# Patient Record
Sex: Female | Born: 1970 | Race: White | Hispanic: No | Marital: Married | Smoking: Never smoker
Health system: Southern US, Community
[De-identification: ages and names within clinical notes are randomized; demographics above are authoritative.]

## PROBLEM LIST (undated history)

## (undated) NOTE — Progress Notes (Signed)
 Formatting of this note is different from the original. Hannah Rice is a 33 yo WF presenting today for concerns about lesions on her vulva. 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 She first noted these areas a few months ago. She had her pap smear with her PCP 08/2021.  Past Medical History:  Diagnosis Date  ? Anxiety   ? Herpes    Past Surgical History:  Procedure Laterality Date  ? SIMPLE VULVECTOMY     OB History    Gravida  0   Para  0   Term  0   Preterm  0   AB  0   Living  0    SAB  0   IAB  0   Ectopic  0   Molar  0   Multiple  0   Live Births  0      Current Outpatient Medications on File Prior to Visit  Medication Sig Dispense Refill  ? montelukast (SINGULAIR) 10 mg tablet Take 10 mg by mouth nightly.    ? sertraline  (ZOLOFT ) 100 MG tablet sertraline  100 mg tablet    ? valACYclovir (VALTREX) 500 MG tablet AS NEEDED TAKE 1 TABLET BY MOUTH EVERY 12 HOURS X 3 DAYS     No current facility-administered medications on file prior to visit.   Allergies  Allergen Reactions  ? Sulfa (Sulfonamide Antibiotics) Hives   Vitals:   05/09/22 0916  BP: 114/76  Pulse: 73  Weight: 155 lb (70.3 kg)  Height: 5' 4 (1.626 m)   Gen; WF in NAD  Pelvic: Vulva with multiple pigmented lesions, 3 raised lesions on left side of vulva near perineum  Area is prepped with betadine, injected with lidocaine , 3.5mm punch biopsy collected, hemostasis achieved with silver nitrate  Vulvar dysplasia  1. Vulvar biopsy collected. Will notify her of results from pathology. 2. Discussed Gardasil. Will do first injection today. 3. She will follow up in August for her AE.  Delon B. Christena, MD, 05/09/2022, 09:49   Electronically signed by Delon FURY Christena, MD at 05/09/2022  9:50 AM EDT

## (undated) NOTE — Addendum Note (Signed)
 Addended by: GIBBS, SHANEQUA LASHAUN on: 05/09/2022 10:07 AM   Modules accepted: Orders   Electronically signed by Kristine Marijane Griffiths, CMA at 05/09/2022 10:07 AM EDT

---

## 2016-03-08 ENCOUNTER — Emergency Department: Payer: BC Managed Care – PPO

## 2016-03-08 ENCOUNTER — Inpatient Hospital Stay
Admission: EM | Admit: 2016-03-08 | Discharge: 2016-03-10 | DRG: 494 | Disposition: A | Discharge status: Home / Self Care | Payer: BC Managed Care – PPO | Attending: Specialist | Admitting: Specialist

## 2016-03-08 ENCOUNTER — Encounter: Payer: Self-pay | Admitting: Emergency Medicine

## 2016-03-08 DIAGNOSIS — S82851A Displaced trimalleolar fracture of right lower leg, initial encounter for closed fracture: Principal | ICD-10-CM | POA: Diagnosis present

## 2016-03-08 DIAGNOSIS — Z882 Allergy status to sulfonamides status: Secondary | ICD-10-CM

## 2016-03-08 DIAGNOSIS — Y929 Unspecified place or not applicable: Secondary | ICD-10-CM

## 2016-03-08 DIAGNOSIS — R262 Difficulty in walking, not elsewhere classified: Secondary | ICD-10-CM

## 2016-03-08 DIAGNOSIS — Y9329 Activity, other involving ice and snow: Secondary | ICD-10-CM

## 2016-03-08 DIAGNOSIS — M25571 Pain in right ankle and joints of right foot: Secondary | ICD-10-CM

## 2016-03-08 DIAGNOSIS — W000XXA Fall on same level due to ice and snow, initial encounter: Secondary | ICD-10-CM | POA: Diagnosis present

## 2016-03-08 DIAGNOSIS — S82891A Other fracture of right lower leg, initial encounter for closed fracture: Secondary | ICD-10-CM | POA: Diagnosis present

## 2016-03-08 DIAGNOSIS — F10129 Alcohol abuse with intoxication, unspecified: Secondary | ICD-10-CM | POA: Diagnosis present

## 2016-03-08 DIAGNOSIS — Z79899 Other long term (current) drug therapy: Secondary | ICD-10-CM

## 2016-03-08 LAB — BASIC METABOLIC PANEL
Anion gap: 9 (ref 5–15)
BUN: 6 mg/dL (ref 6–20)
CHLORIDE: 105 mmol/L (ref 101–111)
CO2: 28 mmol/L (ref 22–32)
CREATININE: 0.64 mg/dL (ref 0.44–1.00)
Calcium: 9.5 mg/dL (ref 8.9–10.3)
GFR calc non Af Amer: >60 mL/min (ref >60)
Glucose, Bld: 131 mg/dL — ABNORMAL HIGH (ref 65–99)
Potassium: 3.6 mmol/L (ref 3.5–5.1)
Sodium: 142 mmol/L (ref 135–145)

## 2016-03-08 LAB — CBC
HEMATOCRIT: 40.9 % (ref 35.0–47.0)
HEMOGLOBIN: 14.1 g/dL (ref 12.0–16.0)
MCH: 33.2 pg (ref 26.0–34.0)
MCHC: 34.4 g/dL (ref 32.0–36.0)
MCV: 96.5 fL (ref 80.0–100.0)
Platelets: 243 10*3/uL (ref 150–440)
RBC: 4.24 MIL/uL (ref 3.80–5.20)
RDW: 13.3 % (ref 11.5–14.5)
WBC: 16.5 10*3/uL — ABNORMAL HIGH (ref 3.6–11.0)

## 2016-03-08 LAB — PROTIME-INR
INR: 1
PROTHROMBIN TIME: 13.2 s (ref 11.4–15.2)

## 2016-03-08 LAB — TYPE AND SCREEN
ABO/RH(D): O POS
ANTIBODY SCREEN: NEGATIVE

## 2016-03-08 LAB — APTT: aPTT: 27 seconds (ref 24–36)

## 2016-03-08 MED ORDER — SODIUM CHLORIDE 0.45 % IV SOLN
INTRAVENOUS | Status: DC
  Administered 2016-03-08: 20:00:00 via INTRAVENOUS

## 2016-03-08 MED ORDER — FENTANYL CITRATE (PF) 100 MCG/2ML IJ SOLN
50.0000 ug | Freq: Once | INTRAMUSCULAR | Status: AC
  Administered 2016-03-08: 50 ug via INTRAVENOUS
  Filled 2016-03-08: qty 2

## 2016-03-08 MED ORDER — FENTANYL CITRATE (PF) 100 MCG/2ML IJ SOLN
INTRAMUSCULAR | Status: AC
  Administered 2016-03-08: 50 ug via INTRAVENOUS
  Filled 2016-03-08: qty 2

## 2016-03-08 MED ORDER — FENTANYL CITRATE (PF) 100 MCG/2ML IJ SOLN: 50.0000 ug | Freq: Once | INTRAMUSCULAR | Status: DC

## 2016-03-08 MED ORDER — MORPHINE SULFATE (PF) 2 MG/ML IV SOLN
INTRAVENOUS | Status: AC
  Filled 2016-03-08: qty 1

## 2016-03-08 MED ORDER — FENTANYL CITRATE (PF) 100 MCG/2ML IJ SOLN
50.0000 ug | Freq: Once | INTRAMUSCULAR | Status: AC
Start: 2016-03-08 — End: 2016-03-08
  Administered 2016-03-08: 50 ug via INTRAVENOUS

## 2016-03-08 MED ORDER — MORPHINE SULFATE (PF) 2 MG/ML IV SOLN
2.0000 mg | Freq: Once | INTRAVENOUS | Status: AC
  Administered 2016-03-08: 2 mg via INTRAVENOUS

## 2016-03-08 NOTE — ED Notes (Signed)
Assisted pt to bedside commode pt tolerated movement well.

## 2016-03-08 NOTE — ED Notes (Signed)
Pt. Resting comfortably in bed. States pain is not excruciating, but uncomfortable. Pending orders from Dr. Hyacinth MeekerMiller.

## 2016-03-08 NOTE — ED Notes (Signed)
Pt. Provided water and ice at this time. Also using phone to call mother. NAD noted, will continue to monitor.

## 2016-03-08 NOTE — H&P (Signed)
PREOPERATIVE H&P  Chief Complaint: Pain right ankle   HPI: Hannah Rice is a 46 y.o. female who fell in the snow today playing with her dogs.  She injured her right ankle and was brought to the emergency room.  Exam and x-rays revealed a trimalleolar fracture dislocation of the ankle.  Closed reduction and splinting were carried out by the emergency room physician.  While I was in the operating room.  I discussed the problem with the patient fully.  Explained her that operative fixation.  Generally pertinent for produces much better results.  Then attempts to treat this as a unstable fracture in a cast.  Some benefits of surgery including infection, anesthesia complications, blood clot and nonunion were discussed.  She has elected for surgical management.  We will plan to do this early tomorrow morning.  Her health is good.  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  History reviewed. No pertinent past medical history. History reviewed. No pertinent surgical history. Social History   Social History  . Marital status: Married    Spouse name: N/A  . Number of children: N/A  . Years of education: N/A   Social History Main Topics  . Smoking status: Never Smoker  . Smokeless tobacco: Never Used  . Alcohol use Yes  . Drug use: Unknown  . Sexual activity: Not Asked   Other Topics Concern  . None   Social History Narrative  . None   No family history on file. Allergies  Allergen Reactions  . Sulfa Antibiotics Hives   Prior to Admission medications   Medication Sig Start Date End Date Taking? Authorizing Provider  clonazePAM (KLONOPIN) 0.5 MG tablet Take 0.5 mg by mouth at bedtime as needed for anxiety.   Yes Historical Provider, MD  fluticasone (FLONASE) 50 MCG/ACT nasal spray Place 2 sprays into both nostrils daily as needed for allergies or rhinitis.   Yes Historical Provider, MD  sertraline (ZOLOFT) 100 MG tablet Take 100 mg by mouth daily.   Yes Historical Provider, MD      Positive ROS: All other systems have been reviewed and were otherwise negative with the exception of those mentioned in the HPI and as above.  Physical Exam: General: Alert, no acute distress Cardiovascular: No pedal edema. Heart is regular and without murmur.  Respiratory: No cyanosis, no use of accessory musculature. Lungs are clear. GI: No organomegaly, abdomen is soft and non-tender Skin: No lesions in the area of chief complaint Neurologic: Sensation intact distally Psychiatric: Patient is competent for consent with normal mood and affect Lymphatic: No axillary or cervical lymphadenopathy  MUSCULOSKELETAL: The right ankle is splinted.  Her vascular status is good.  No other injuries are noted.  Hip and knee motion is good.  Dressings are clean and dry.  Emergency room physician.  Relates that the skin was intact.  Assessment: Trimalleolar fracture dislocation, right ankle  Plan: Open reduction, internal fixation right ankle in a.m.  The risks benefits and alternatives were discussed with the patient including but not limited to the risks of nonoperative treatment, versus surgical intervention including infection, bleeding, nerve injury,  blood clots, cardiopulmonary complications, morbidity, mortality, among others, and they were willing to proceed.   Valinda HoarMILLER,Remell Giaimo E, MD [phone]   03/08/2016 10:26 PM

## 2016-03-08 NOTE — ED Provider Notes (Addendum)
[facility] Emergency Department Provider Note  ____________________________________________  Time seen: Approximately 4:48 PM  I have reviewed the triage vital signs and the nursing notes.   HISTORY  Chief Complaint Ankle Injury    HPI Hannah Rice is a 46 y.o. female , otherwise healthy nonpregnant, presenting with right ankle pain and deformity after mechanical fall. The patient states that she tripped, did not hit her head or lose consciousness. She attempted to ambulate but was unable to get back up. She denies any numbness or tingling, or skin break. No neck or back pain.+ sign ETOH today.   History reviewed. No pertinent past medical history.  There are no active problems to display for this patient.   History reviewed. No pertinent surgical history.    Allergies Sulfa antibiotics  No family history on file.  Social History Social History  Substance Use Topics  . Smoking status: Never Smoker  . Smokeless tobacco: Never Used  . Alcohol use Yes    Review of Systems Constitutional: No fever/chills.Positive mechanical fall. No loss of consciousness. No lightheadedness or syncope. Eyes: No visual changes. ENT: No sore throat. No congestion or rhinorrhea. Cardiovascular: Denies chest pain. Denies palpitations. Respiratory: Denies shortness of breath.  No cough. Gastrointestinal: No abdominal pain.  No nausea, no vomiting.  No diarrhea.  No constipation. Genitourinary: Negative for dysuria. Musculoskeletal: Negative for back pain.No neck pain. Positive right ankle pain. Skin: Negative for rash. Neurological: Negative for headaches. No focal numbness, tingling or weakness.   10-point ROS otherwise negative.  ____________________________________________   PHYSICAL EXAM:  VITAL SIGNS: ED Triage Vitals [03/08/16 1641]  Enc Vitals Group     BP 122/77     Pulse Rate 100     Resp 20     Temp 97.6 F (36.4 C)     Temp Source Oral   SpO2 96 %     Weight 138 lb (62.6 kg)     Height 5\' 4"  (1.626 m)     Head Circumference      Peak Flow      Pain Score 6     Pain Loc      Pain Edu?      Excl. in GC?     Constitutional: Alert and oriented. Well appearing and in no acute distress. Pleasant.  Answers questions appropriately. Eyes: Conjunctivae are normal.  EOMI. No scleral icterus. Head: Atraumatic. Nose: No congestion/rhinnorhea. Mouth/Throat: Mucous membranes are moist.  Neck: No stridor.  Supple.  Full ROM w/o pain. Cardiovascular: Normal rate, regular rhythm. No murmurs, rubs or gallops.  Respiratory: Normal respiratory effort.  No accessory muscle use or retractions. Lungs CTAB.  No wheezes, rales or ronchi. Musculoskeletal: R ankle deformity with instability on the medial malleolus.  Normal DP and PT pulse on R.  Normal sensation to light touch in the R foot.  Full ROM of the R knee and hip without pain.  Skin intact. Neurologic:  A&Ox3.  Speech is clear.  Face and smile are symmetric.  EOMI.  Moves all extremities well. Skin:  Skin is warm, dry and intact. No rash noted. Psychiatric: Mood and affect are normal. Speech and behavior are normal.  Normal judgement.  ____________________________________________   LABS (all labs ordered are listed, but only abnormal results are displayed)  Labs Reviewed  CBC  BASIC METABOLIC PANEL  APTT  PROTIME-INR  TYPE AND SCREEN   ____________________________________________  EKG  ED ECG REPORT I, Rockne MenghiniNorman, Anne-Caroline, the attending physician, personally viewed  and interpreted this ECG.   Date: 03/08/2016  EKG Time: 1941  Rate: 101  Rhythm: sinus tachycardia  Axis: normal  Intervals:none  ST&T Change: nonspecific T-wave inversions in V1. No ST elevation.  ____________________________________________  RADIOLOGY  Dg Ankle Complete Right  Result Date: 03/08/2016 CLINICAL DATA:  Deformed and swollen right ankle.  Injury. EXAM: RIGHT ANKLE - COMPLETE 3+ VIEW  COMPARISON:  None. FINDINGS: Three views study shows long oblique fracture distal fibula with medial malleolus and posterior lip fracture of the distal tibia. Distal tibia is anterior dislocated relative to the talar dome. IMPRESSION: Trimalleolar fracture dislocation at the ankle. Electronically Signed   By: Kennith Center M.D.   On: 03/08/2016 17:21    ____________________________________________   PROCEDURES  Procedure(s) performed: None  Procedures  Critical Care performed: No ____________________________________________   INITIAL IMPRESSION / ASSESSMENT AND PLAN / ED COURSE  Pertinent labs & imaging results that were available during my care of the patient were reviewed by me and considered in my medical decision making (see chart for details).  46 y.o. F, intoxicated but otherwise healthy, presenting w/ R ankle injury after mechanical fall.  She is neurovascularly intact in her skin is intact. We'll get x-rays, and initiate pain control, and reevaluate the patient for final disposition.  ----------------------------------------- 5:57 PM on 03/08/2016 -----------------------------------------  The patient is a trimalleolar fracture and has been admitted to Dr. Hyacinth Meeker, who will take her to the operating room tomorrow. In the meantime, I'll place her in a stirrup and posterior splint with as much reduction is possible.  Reduction of dislocation Date/Time: 6:52 PM Performed by: Rockne Menghini Authorized by: Rockne Menghini Consent: Verbal consent obtained. Risks and benefits: risks, benefits and alternatives were discussed Consent given by: patient Time out: Immediately prior to procedure a "time out" was called to verify the correct patient, procedure, equipment, support staff and site/side marked as required.  Patient sedated: No; opioid analgesia.  Vitals: Vital signs were monitored during sedation. Patient tolerance: Patient tolerated the procedure well with  no immediate complications. Joint: right ankle Reduction technique: manual    SPLINT APPLICATION Date/Time: 6:52 PM Authorized by: Rockne Menghini Consent: Verbal consent obtained. Risks and benefits: risks, benefits and alternatives were discussed Consent given by: patient Splint applied by: ED technician Location details: Right ankle Splint type: Stirrup and Posterior Slab Supplies used: Orthoglass Post-procedure: The splinted body part was neurovascularly unchanged following the procedure. Patient tolerance: Patient tolerated the procedure well with no immediate complications.    ____________________________________________  FINAL CLINICAL IMPRESSION(S) / ED DIAGNOSES  Final diagnoses:  Closed trimalleolar fracture of right ankle, initial encounter    Clinical Course       NEW MEDICATIONS STARTED DURING THIS VISIT:  New Prescriptions   No medications on file      Rockne Menghini, MD 03/08/16 1758    Rockne Menghini, MD 03/08/16 1854    Rockne Menghini, MD 03/08/16 2028

## 2016-03-08 NOTE — ED Triage Notes (Signed)
Patient presents to the ED with obvious deformity and swelling to her right ankle. Patient reports "stepping wrong" while trying to walk her dog.  Patient reports drinking alcohol today prior to arrival.  This RN felt pulses in patient's foot.

## 2016-03-08 NOTE — ED Notes (Signed)
Pt from home with deformed/swollen right ankle. Pt states she was walking her dog in the snow and stepped wrong. She denies hitting her head or LOC. Pt affirms that she has been drinking and that probably had something to do with it. Pt alert & oriented. NAD noted

## 2016-03-09 ENCOUNTER — Emergency Department: Payer: BC Managed Care – PPO | Admitting: Anesthesiology

## 2016-03-09 ENCOUNTER — Encounter
Admission: EM | Disposition: A | Discharge status: Home / Self Care | Payer: Self-pay | Source: Home / Self Care | Attending: Specialist

## 2016-03-09 ENCOUNTER — Encounter: Payer: Self-pay | Admitting: *Deleted

## 2016-03-09 DIAGNOSIS — S82891A Other fracture of right lower leg, initial encounter for closed fracture: Secondary | ICD-10-CM | POA: Diagnosis present

## 2016-03-09 DIAGNOSIS — Y929 Unspecified place or not applicable: Secondary | ICD-10-CM | POA: Diagnosis not present

## 2016-03-09 DIAGNOSIS — S82851A Displaced trimalleolar fracture of right lower leg, initial encounter for closed fracture: Secondary | ICD-10-CM | POA: Diagnosis present

## 2016-03-09 DIAGNOSIS — F10129 Alcohol abuse with intoxication, unspecified: Secondary | ICD-10-CM | POA: Diagnosis present

## 2016-03-09 DIAGNOSIS — Z79899 Other long term (current) drug therapy: Secondary | ICD-10-CM | POA: Diagnosis not present

## 2016-03-09 DIAGNOSIS — Z882 Allergy status to sulfonamides status: Secondary | ICD-10-CM | POA: Diagnosis not present

## 2016-03-09 DIAGNOSIS — Y9329 Activity, other involving ice and snow: Secondary | ICD-10-CM | POA: Diagnosis not present

## 2016-03-09 DIAGNOSIS — W000XXA Fall on same level due to ice and snow, initial encounter: Secondary | ICD-10-CM | POA: Diagnosis present

## 2016-03-09 HISTORY — PX: ORIF ANKLE FRACTURE: SHX5408

## 2016-03-09 LAB — PREGNANCY, URINE: PREG TEST UR: NEGATIVE

## 2016-03-09 SURGERY — OPEN REDUCTION INTERNAL FIXATION (ORIF) ANKLE FRACTURE
Anesthesia: General | Site: Ankle | Laterality: Right | Wound class: Clean

## 2016-03-09 MED ORDER — ONDANSETRON HCL 4 MG PO TABS: 4.0000 mg | ORAL_TABLET | Freq: Four times a day (QID) | ORAL | Status: DC | PRN

## 2016-03-09 MED ORDER — DEXAMETHASONE SODIUM PHOSPHATE 10 MG/ML IJ SOLN
INTRAMUSCULAR | Status: AC
  Filled 2016-03-09: qty 1

## 2016-03-09 MED ORDER — MELOXICAM 7.5 MG PO TABS
15.0000 mg | ORAL_TABLET | Freq: Every day | ORAL | Status: DC
  Administered 2016-03-10: 15 mg via ORAL
  Filled 2016-03-09: qty 2

## 2016-03-09 MED ORDER — MORPHINE SULFATE (PF) 2 MG/ML IV SOLN: 2.0000 mg | INTRAVENOUS | Status: DC | PRN

## 2016-03-09 MED ORDER — MORPHINE SULFATE (PF) 2 MG/ML IV SOLN
INTRAVENOUS | Status: AC
  Administered 2016-03-09: 2 mg via INTRAVENOUS
  Filled 2016-03-09: qty 1

## 2016-03-09 MED ORDER — GABAPENTIN 400 MG PO CAPS
400.0000 mg | ORAL_CAPSULE | Freq: Two times a day (BID) | ORAL | Status: DC
  Administered 2016-03-09 – 2016-03-10 (×2): 400 mg via ORAL
  Filled 2016-03-09 (×2): qty 1

## 2016-03-09 MED ORDER — FENTANYL CITRATE (PF) 250 MCG/5ML IJ SOLN
INTRAMUSCULAR | Status: AC
  Filled 2016-03-09: qty 5

## 2016-03-09 MED ORDER — FENTANYL CITRATE (PF) 100 MCG/2ML IJ SOLN: 25.0000 ug | INTRAMUSCULAR | Status: DC | PRN

## 2016-03-09 MED ORDER — LIDOCAINE HCL (PF) 4 % IJ SOLN
INTRAMUSCULAR | Status: DC | PRN
  Administered 2016-03-09: 4 mL via RESPIRATORY_TRACT

## 2016-03-09 MED ORDER — CHLORHEXIDINE GLUCONATE CLOTH 2 % EX PADS: 6.0000 | MEDICATED_PAD | Freq: Once | CUTANEOUS | Status: DC

## 2016-03-09 MED ORDER — INFLUENZA VAC SPLIT QUAD 0.5 ML IM SUSY: 0.5000 mL | PREFILLED_SYRINGE | INTRAMUSCULAR | Status: DC

## 2016-03-09 MED ORDER — PROPOFOL 10 MG/ML IV BOLUS
INTRAVENOUS | Status: DC | PRN
  Administered 2016-03-09: 150 mg via INTRAVENOUS

## 2016-03-09 MED ORDER — KETOROLAC TROMETHAMINE 30 MG/ML IJ SOLN
INTRAMUSCULAR | Status: AC
  Filled 2016-03-09: qty 1

## 2016-03-09 MED ORDER — MIDAZOLAM HCL 2 MG/2ML IJ SOLN
INTRAMUSCULAR | Status: DC | PRN
  Administered 2016-03-09: 2 mg via INTRAVENOUS

## 2016-03-09 MED ORDER — LIDOCAINE HCL (CARDIAC) 20 MG/ML IV SOLN
INTRAVENOUS | Status: DC | PRN
  Administered 2016-03-09: 100 mg via INTRAVENOUS

## 2016-03-09 MED ORDER — BUPIVACAINE HCL 0.5 % IJ SOLN
INTRAMUSCULAR | Status: DC | PRN
  Administered 2016-03-09: 30 mL

## 2016-03-09 MED ORDER — PROPOFOL 10 MG/ML IV BOLUS
INTRAVENOUS | Status: AC
  Filled 2016-03-09: qty 20

## 2016-03-09 MED ORDER — CLONAZEPAM 0.5 MG PO TABS: 0.5000 mg | ORAL_TABLET | Freq: Every evening | ORAL | Status: DC | PRN

## 2016-03-09 MED ORDER — METHOCARBAMOL 1000 MG/10ML IJ SOLN
500.0000 mg | Freq: Four times a day (QID) | INTRAVENOUS | Status: DC | PRN
  Filled 2016-03-09: qty 5

## 2016-03-09 MED ORDER — FENTANYL CITRATE (PF) 100 MCG/2ML IJ SOLN
INTRAMUSCULAR | Status: DC | PRN
  Administered 2016-03-09: 200 ug via INTRAVENOUS
  Administered 2016-03-09: 50 ug via INTRAVENOUS
  Administered 2016-03-09: 100 ug via INTRAVENOUS

## 2016-03-09 MED ORDER — GABAPENTIN 300 MG PO CAPS
300.0000 mg | ORAL_CAPSULE | ORAL | Status: AC
  Administered 2016-03-09: 300 mg via ORAL

## 2016-03-09 MED ORDER — ACETAMINOPHEN 500 MG PO TABS
ORAL_TABLET | ORAL | Status: AC
  Filled 2016-03-09: qty 2

## 2016-03-09 MED ORDER — FLEET ENEMA 7-19 GM/118ML RE ENEM: 1.0000 | ENEMA | Freq: Once | RECTAL | Status: DC | PRN

## 2016-03-09 MED ORDER — BUPIVACAINE HCL (PF) 0.5 % IJ SOLN
INTRAMUSCULAR | Status: AC
  Filled 2016-03-09: qty 30

## 2016-03-09 MED ORDER — METOCLOPRAMIDE HCL 10 MG PO TABS: 5.0000 mg | ORAL_TABLET | Freq: Three times a day (TID) | ORAL | Status: DC | PRN

## 2016-03-09 MED ORDER — FENTANYL CITRATE (PF) 100 MCG/2ML IJ SOLN
INTRAMUSCULAR | Status: AC
  Filled 2016-03-09: qty 2

## 2016-03-09 MED ORDER — NEOMYCIN-POLYMYXIN B GU 40-200000 IR SOLN
Status: DC | PRN
  Administered 2016-03-09: 4 mL

## 2016-03-09 MED ORDER — METOCLOPRAMIDE HCL 5 MG/ML IJ SOLN: 5.0000 mg | Freq: Three times a day (TID) | INTRAMUSCULAR | Status: DC | PRN

## 2016-03-09 MED ORDER — GABAPENTIN 300 MG PO CAPS
ORAL_CAPSULE | ORAL | Status: AC
  Administered 2016-03-09: 300 mg via ORAL
  Filled 2016-03-09: qty 1

## 2016-03-09 MED ORDER — ACETAMINOPHEN 10 MG/ML IV SOLN
INTRAVENOUS | Status: DC | PRN
  Administered 2016-03-09: 1000 mg via INTRAVENOUS

## 2016-03-09 MED ORDER — MORPHINE SULFATE (PF) 2 MG/ML IV SOLN
2.0000 mg | INTRAVENOUS | Status: AC | PRN
Start: 2016-03-09 — End: 2016-03-09
  Administered 2016-03-09: 2 mg via INTRAVENOUS
  Filled 2016-03-09: qty 1

## 2016-03-09 MED ORDER — SODIUM CHLORIDE 0.45 % IV SOLN: INTRAVENOUS | Status: DC

## 2016-03-09 MED ORDER — CLINDAMYCIN PHOSPHATE 600 MG/50ML IV SOLN
INTRAVENOUS | Status: AC
  Filled 2016-03-09: qty 50

## 2016-03-09 MED ORDER — DEXMEDETOMIDINE HCL 200 MCG/2ML IV SOLN
INTRAVENOUS | Status: DC | PRN
  Administered 2016-03-09 (×2): 20 ug via INTRAVENOUS

## 2016-03-09 MED ORDER — HYDROCODONE-ACETAMINOPHEN 7.5-325 MG PO TABS
1.0000 | ORAL_TABLET | ORAL | Status: DC | PRN
  Administered 2016-03-09 – 2016-03-10 (×6): 1 via ORAL
  Filled 2016-03-09 (×6): qty 1

## 2016-03-09 MED ORDER — ENOXAPARIN SODIUM 30 MG/0.3ML ~~LOC~~ SOLN
30.0000 mg | SUBCUTANEOUS | Status: DC
  Administered 2016-03-10: 30 mg via SUBCUTANEOUS
  Filled 2016-03-09: qty 0.3

## 2016-03-09 MED ORDER — OXYCODONE HCL 5 MG PO TABS: 5.0000 mg | ORAL_TABLET | Freq: Once | ORAL | Status: DC | PRN

## 2016-03-09 MED ORDER — ROCURONIUM BROMIDE 100 MG/10ML IV SOLN
INTRAVENOUS | Status: DC | PRN
  Administered 2016-03-09: 40 mg via INTRAVENOUS

## 2016-03-09 MED ORDER — ACETAMINOPHEN 650 MG RE SUPP: 650.0000 mg | Freq: Four times a day (QID) | RECTAL | Status: DC | PRN

## 2016-03-09 MED ORDER — CLINDAMYCIN PHOSPHATE 600 MG/50ML IV SOLN
600.0000 mg | INTRAVENOUS | Status: AC
  Administered 2016-03-09: 600 mg via INTRAVENOUS

## 2016-03-09 MED ORDER — FLUTICASONE PROPIONATE 50 MCG/ACT NA SUSP
2.0000 | Freq: Every day | NASAL | Status: DC | PRN
Start: 2016-03-09 — End: 2016-03-10
  Filled 2016-03-09: qty 16

## 2016-03-09 MED ORDER — MAGNESIUM HYDROXIDE 400 MG/5ML PO SUSP: 30.0000 mL | Freq: Every day | ORAL | Status: DC | PRN

## 2016-03-09 MED ORDER — SERTRALINE HCL 100 MG PO TABS
100.0000 mg | ORAL_TABLET | Freq: Every day | ORAL | Status: DC
  Administered 2016-03-09 – 2016-03-10 (×2): 100 mg via ORAL
  Filled 2016-03-09 (×2): qty 1

## 2016-03-09 MED ORDER — ONDANSETRON HCL 4 MG/2ML IJ SOLN
INTRAMUSCULAR | Status: DC | PRN
  Administered 2016-03-09: 4 mg via INTRAVENOUS

## 2016-03-09 MED ORDER — CEFAZOLIN SODIUM-DEXTROSE 2-4 GM/100ML-% IV SOLN
2.0000 g | INTRAVENOUS | Status: AC
  Administered 2016-03-09: 2 g via INTRAVENOUS

## 2016-03-09 MED ORDER — ONDANSETRON HCL 4 MG/2ML IJ SOLN
4.0000 mg | Freq: Four times a day (QID) | INTRAMUSCULAR | Status: DC | PRN
Start: 2016-03-09 — End: 2016-03-10

## 2016-03-09 MED ORDER — SODIUM CHLORIDE 0.45 % IV SOLN
INTRAVENOUS | Status: DC
  Administered 2016-03-09 – 2016-03-10 (×3): via INTRAVENOUS

## 2016-03-09 MED ORDER — MORPHINE SULFATE (PF) 2 MG/ML IV SOLN
2.0000 mg | Freq: Once | INTRAVENOUS | Status: AC
Start: 2016-03-09 — End: 2016-03-09
  Administered 2016-03-09: 2 mg via INTRAVENOUS
  Filled 2016-03-09: qty 1

## 2016-03-09 MED ORDER — LACTATED RINGERS IV SOLN
INTRAVENOUS | Status: DC | PRN
  Administered 2016-03-09: 12:00:00 via INTRAVENOUS

## 2016-03-09 MED ORDER — CLINDAMYCIN PHOSPHATE 600 MG/50ML IV SOLN
600.0000 mg | Freq: Three times a day (TID) | INTRAVENOUS | Status: AC
  Administered 2016-03-09 – 2016-03-10 (×3): 600 mg via INTRAVENOUS
  Filled 2016-03-09 (×5): qty 50

## 2016-03-09 MED ORDER — OXYCODONE HCL 5 MG/5ML PO SOLN: 5.0000 mg | Freq: Once | ORAL | Status: DC | PRN

## 2016-03-09 MED ORDER — CEFAZOLIN SODIUM-DEXTROSE 2-4 GM/100ML-% IV SOLN
INTRAVENOUS | Status: AC
Start: 2016-03-09 — End: 2016-03-09
  Filled 2016-03-09: qty 100

## 2016-03-09 MED ORDER — MIDAZOLAM HCL 2 MG/2ML IJ SOLN
INTRAMUSCULAR | Status: AC
  Filled 2016-03-09: qty 2

## 2016-03-09 MED ORDER — METHOCARBAMOL 500 MG PO TABS: 500.0000 mg | ORAL_TABLET | Freq: Four times a day (QID) | ORAL | Status: DC | PRN

## 2016-03-09 MED ORDER — BISACODYL 10 MG RE SUPP: 10.0000 mg | Freq: Every day | RECTAL | Status: DC | PRN

## 2016-03-09 MED ORDER — ACETAMINOPHEN 10 MG/ML IV SOLN
INTRAVENOUS | Status: AC
  Filled 2016-03-09: qty 100

## 2016-03-09 MED ORDER — DEXAMETHASONE SODIUM PHOSPHATE 4 MG/ML IJ SOLN
INTRAMUSCULAR | Status: DC | PRN
  Administered 2016-03-09: 10 mg via INTRAVENOUS

## 2016-03-09 MED ORDER — CEFAZOLIN SODIUM-DEXTROSE 2-4 GM/100ML-% IV SOLN
2.0000 g | Freq: Four times a day (QID) | INTRAVENOUS | Status: AC
  Administered 2016-03-09 – 2016-03-10 (×2): 2 g via INTRAVENOUS
  Filled 2016-03-09 (×3): qty 100

## 2016-03-09 MED ORDER — SENNA 8.6 MG PO TABS
1.0000 | ORAL_TABLET | Freq: Two times a day (BID) | ORAL | Status: DC
  Administered 2016-03-09 – 2016-03-10 (×2): 8.6 mg via ORAL
  Filled 2016-03-09 (×2): qty 1

## 2016-03-09 MED ORDER — ROCURONIUM BROMIDE 50 MG/5ML IV SOSY
PREFILLED_SYRINGE | INTRAVENOUS | Status: AC
  Filled 2016-03-09: qty 5

## 2016-03-09 MED ORDER — ACETAMINOPHEN 325 MG PO TABS: 650.0000 mg | ORAL_TABLET | Freq: Four times a day (QID) | ORAL | Status: DC | PRN

## 2016-03-09 SURGICAL SUPPLY — 55 items
BASIN GRAD PLASTIC 32OZ STRL (MISCELLANEOUS) ×2 IMPLANT
BIT DRILL 2.5 X LONG (BIT) ×1
BIT DRILL 2.8 (BIT) ×1
BIT DRILL CANN 2.7X625 NONSTRL (BIT) ×2 IMPLANT
BIT DRILL CANN QC 2.8X165 (BIT) ×1 IMPLANT
BIT DRILL X LONG 2.5 (BIT) ×1 IMPLANT
BLADE SURG SZ10 CARB STEEL (BLADE) ×2 IMPLANT
BNDG COHESIVE 4X5 TAN STRL (GAUZE/BANDAGES/DRESSINGS) ×2 IMPLANT
BNDG ESMARK 6X12 TAN STRL LF (GAUZE/BANDAGES/DRESSINGS) ×2 IMPLANT
CANISTER SUCT 1200ML W/VALVE (MISCELLANEOUS) ×2 IMPLANT
CHLORAPREP W/TINT 26ML (MISCELLANEOUS) ×2 IMPLANT
CUFF TOURN 24 STER (MISCELLANEOUS) ×2 IMPLANT
DRAPE FLUOR MINI C-ARM 54X84 (DRAPES) ×2 IMPLANT
DRILL BIT 2.8MM (BIT) ×1
DRILL BIT X LONG 2.5 (BIT) ×1
ELECT REM PT RETURN 9FT ADLT (ELECTROSURGICAL) ×2
ELECTRODE REM PT RTRN 9FT ADLT (ELECTROSURGICAL) ×1 IMPLANT
GAUZE PETRO XEROFOAM 1X8 (MISCELLANEOUS) ×4 IMPLANT
GAUZE SPONGE 4X4 12PLY STRL (GAUZE/BANDAGES/DRESSINGS) ×2 IMPLANT
GLOVE SURG ORTHO 8.0 STRL STRW (GLOVE) ×2 IMPLANT
GOWN STRL REUS W/ TWL LRG LVL3 (GOWN DISPOSABLE) ×1 IMPLANT
GOWN STRL REUS W/TWL LRG LVL3 (GOWN DISPOSABLE) ×1
GOWN STRL REUS W/TWL LRG LVL4 (GOWN DISPOSABLE) ×2 IMPLANT
GUIDEWARE NON THREAD 1.25X150 (WIRE) ×8
GUIDEWIRE NON THREAD 1.25X150 (WIRE) ×4 IMPLANT
HANDLE YANKAUER SUCT BULB TIP (MISCELLANEOUS) ×2 IMPLANT
KIT RM TURNOVER STRD PROC AR (KITS) ×2 IMPLANT
LABEL OR SOLS (LABEL) ×2 IMPLANT
NS IRRIG 1000ML POUR BTL (IV SOLUTION) ×2 IMPLANT
PACK EXTREMITY ARMC (MISCELLANEOUS) ×2 IMPLANT
PAD PREP 24X41 OB/GYN DISP (PERSONAL CARE ITEMS) ×2 IMPLANT
PADDING CAST 4IN STRL (MISCELLANEOUS) ×2
PADDING CAST BLEND 4X4 STRL (MISCELLANEOUS) ×2 IMPLANT
PLATE LCP RECON 3.5 6H/84 (Plate) ×2 IMPLANT
SCREW CANN L THRD/50 4.0 (Screw) ×4 IMPLANT
SCREW CANN S THRD/36 4.0 (Screw) ×2 IMPLANT
SCREW CANN S THRD/38 4.0 (Screw) ×2 IMPLANT
SCREW CORTEX 3.5 12MM (Screw) ×2 IMPLANT
SCREW CORTEX 3.5 14MM (Screw) ×1 IMPLANT
SCREW CORTEX 3.5 18MM (Screw) ×1 IMPLANT
SCREW LOCK CORT ST 3.5X12 (Screw) ×2 IMPLANT
SCREW LOCK CORT ST 3.5X14 (Screw) ×1 IMPLANT
SCREW LOCK CORT ST 3.5X18 (Screw) ×1 IMPLANT
SCREW LOCK T15 FT 20X3.5XST (Screw) ×2 IMPLANT
SCREW LOCKING 3.5X20 (Screw) ×2 IMPLANT
SPLINT CAST 1 STEP 4X30 (MISCELLANEOUS) ×2 IMPLANT
SPLINT CAST 1 STEP 5X30 WHT (MISCELLANEOUS) ×2 IMPLANT
SPONGE LAP 18X18 5 PK (GAUZE/BANDAGES/DRESSINGS) ×2 IMPLANT
STAPLER SKIN PROX 35W (STAPLE) ×2 IMPLANT
STOCKINETTE BIAS CUT 6 980064 (GAUZE/BANDAGES/DRESSINGS) ×2 IMPLANT
STOCKINETTE STRL 6IN 960660 (GAUZE/BANDAGES/DRESSINGS) ×2 IMPLANT
SUT VIC AB 2-0 CT1 27 (SUTURE) ×1
SUT VIC AB 2-0 CT1 TAPERPNT 27 (SUTURE) ×1 IMPLANT
SUT VIC AB 3-0 SH 27 (SUTURE) ×1
SUT VIC AB 3-0 SH 27X BRD (SUTURE) ×1 IMPLANT

## 2016-03-09 NOTE — Anesthesia Post-op Follow-up Note (Cosign Needed)
Anesthesia QCDR form completed.        

## 2016-03-09 NOTE — Transfer of Care (Signed)
Immediate Anesthesia Transfer of Care Note  Patient: Hannah HerrlichMary [name]  Procedure(s) Performed: Procedure(s): OPEN REDUCTION INTERNAL FIXATION (ORIF) ANKLE FRACTURE (Right)  Patient Location: PACU  Anesthesia Type:General  Level of Consciousness: awake, alert , oriented and patient cooperative  Airway & Oxygen Therapy: Patient Spontanous Breathing and Patient connected to nasal cannula oxygen  Post-op Assessment: Report given to RN and Post -op Vital signs reviewed and stable  Post vital signs: Reviewed and stable  Last Vitals:  Vitals:   03/09/16 0619 03/09/16 1122  BP: (!) 135/96 (!) 136/96  Pulse: 88 90  Resp: 18 16  Temp:  36.1 C    Last Pain:  Vitals:   03/09/16 1122  TempSrc: Oral  PainSc: 5          Complications: No apparent anesthesia complications

## 2016-03-09 NOTE — Progress Notes (Signed)
Patient rec'd from OR. Dressing dry and intact with ice pack. Husband at bedside

## 2016-03-09 NOTE — ED Notes (Signed)
Pt requesting more pain medication, Dr Hyacinth MeekerMiller paged.

## 2016-03-09 NOTE — ED Notes (Signed)
Pt. Resting in bed in NAD at this time. WIll continue to monitor.

## 2016-03-09 NOTE — ED Notes (Signed)
Pt. Provided pillow and position change at this time. Reports mild persistent discomfort.

## 2016-03-09 NOTE — H&P (Signed)
THE PATIENT WAS SEEN PRIOR TO SURGERY TODAY.  HISTORY, ALLERGIES, HOME MEDICATIONS AND OPERATIVE PROCEDURE WERE REVIEWED. RISKS AND BENEFITS OF SURGERY DISCUSSED WITH PATIENT AGAIN.  NO CHANGES FROM INITIAL HISTORY AND PHYSICAL NOTED.    

## 2016-03-09 NOTE — Anesthesia Procedure Notes (Signed)
Procedure Name: Intubation Date/Time: 03/09/2016 12:29 PM Performed by: Rosaria Ferries, Aarion Kittrell Pre-anesthesia Checklist: Patient identified, Emergency Drugs available, Suction available and Patient being monitored Patient Re-evaluated:Patient Re-evaluated prior to inductionOxygen Delivery Method: Circle system utilized Preoxygenation: Pre-oxygenation with 100% oxygen Intubation Type: IV induction Laryngoscope Size: Mac and 3 Grade View: Grade I Tube size: 7.0 mm Number of attempts: 1 Airway Equipment and Method: LTA kit utilized Placement Confirmation: ETT inserted through vocal cords under direct vision,  positive ETCO2 and breath sounds checked- equal and bilateral Secured at: 21 cm Tube secured with: Tape Dental Injury: Teeth and Oropharynx as per pre-operative assessment

## 2016-03-09 NOTE — Anesthesia Preprocedure Evaluation (Signed)
Anesthesia Evaluation  Patient identified by MRN, date of birth, ID band Patient awake    Reviewed: Allergy & Precautions, H&P , NPO status , Patient's Chart, lab work & pertinent test results  History of Anesthesia Complications Negative for: history of anesthetic complications  Airway Mallampati: II  TM Distance: >3 FB Neck ROM: full    Dental no notable dental hx. (+) Poor Dentition, Chipped   Pulmonary neg shortness of breath, Current Smoker,    Pulmonary exam normal breath sounds clear to auscultation       Cardiovascular Exercise Tolerance: Good (-) angina(-) Past MI and (-) DOE negative cardio ROS Normal cardiovascular exam Rhythm:regular Rate:Normal     Neuro/Psych negative neurological ROS  negative psych ROS   GI/Hepatic negative GI ROS, Neg liver ROS, neg GERD  ,  Endo/Other  negative endocrine ROS  Renal/GU      Musculoskeletal   Abdominal   Peds  Hematology negative hematology ROS (+)   Anesthesia Other Findings History reviewed. No pertinent past medical history.  History reviewed. No pertinent surgical history.  BMI    Body Mass Index:  23.69 kg/m      Reproductive/Obstetrics negative OB ROS                             Anesthesia Physical Anesthesia Plan  ASA: II  Anesthesia Plan: General ETT   Post-op Pain Management:    Induction:   Airway Management Planned:   Additional Equipment:   Intra-op Plan:   Post-operative Plan:   Informed Consent: I have reviewed the patients History and Physical, chart, labs and discussed the procedure including the risks, benefits and alternatives for the proposed anesthesia with the patient or authorized representative who has indicated his/her understanding and acceptance.   Dental Advisory Given  Plan Discussed with: Anesthesiologist, CRNA and Surgeon  Anesthesia Plan Comments:         Anesthesia Quick  Evaluation

## 2016-03-09 NOTE — ED Notes (Signed)
Dr Hyacinth Meekermiller gave this RN telephone orders for 2mg  IV morphine x1 now.

## 2016-03-09 NOTE — Op Note (Signed)
03/08/2016 - 03/09/2016  PATIENT:  Hannah HerrlichMary C Kirchgessner    PRE-OPERATIVE DIAGNOSIS: Trimalleolar fracture dislocation right ankle  POST-OPERATIVE DIAGNOSIS:  Same  PROCEDURE:  OPEN REDUCTION INTERNAL FIXATION (ORIF) ANKLE FRACTURE  SURGEON:  Valinda HoarMILLER,Jazzlynn Rawe E, MD  ANESTHESIA:   General  TOURNIQUET TIME: 4583  MIN  PREOPERATIVE INDICATIONS:  Hannah HerrlichMary C Schwarzkopf is a  46 y.o. female with a diagnosis of fractured right ankle who elected for surgical management to minimize the risk for malunion and nonunion and post-traumatic arthritis.    The risks benefits and alternatives were discussed with the patient preoperatively including but not limited to the risks of infection, bleeding, nerve injury, cardiopulmonary complications, the need for revision surgery, the need for hardware removal, among others, and the patient was willing to proceed.  OPERATIVE IMPLANTS: Synthes locking plate, with a single interfragmentary lag screw,  two 4.0 mm cannulated screws for the medial malleolus and two 4.0 mm cannulated screws anterior to posterior distal tibia  OPERATIVE PROCEDURE: The patient was brought to the operating room and placed in the supine position. All bony prominences were padded. General anesthesia was administered. The lower extremity was prepped and draped in the usual sterile fashion. The leg was elevated and exsanguinated and the tourniquet was inflated. Time out was performed.   Incision was made over the distal fibula and the fracture was exposed and reduced anatomically with a clamp. A lag screw was placed. I then applied a small fragment locking plate and secured it proximally with cortical screws and distally with cortical and locking screws.  I used the Fluoroscan to confirm satisfactory reduction and fixation. This wound was irrigated and closed with vicryl sutures and staples.  I then turned my attention to the medial malleolus. Incision was made over the medial malleolus and the fracture exposed and held  provisionally with a clamp. 2 guidepins were placed for the 4.0 mm cannulated screws and then confirmation of reduction was made with fluoroscopy. I then placed 2  40mm screws which had satisfactory fixation. Fluoroscopy showed good reduction and hardware placement.   An anterior stab wound was made anterolaterally over the distal tibia and dissection carried down to bone avoiding the neurovascular structures. Two k-wires were passed anterior to posterior under fluoroscopy and then two 4.0 cannulated screws placed to secure the posterior malleolus in anatomic postion.  The syndesmosis was stressed using live fluoroscopy and found to be stable.   The medial and anterior wound was irrigated, and closed with vicryl and staples. Sponge and needle counts were correct. The wounds were injected with local anesthetic. Sterile gauze was applied followed by a posterior splint. She was awakened and returned to the PACU in stable and satisfactory condition. There were no complications.  Valinda HoarHoward E Malaysha Arlen, MD

## 2016-03-10 ENCOUNTER — Encounter: Payer: Self-pay | Admitting: Specialist

## 2016-03-10 MED ORDER — HYDROCODONE-ACETAMINOPHEN 7.5-325 MG PO TABS: 1.0000 | ORAL_TABLET | Freq: Four times a day (QID) | ORAL | 0 refills | Status: AC | PRN

## 2016-03-10 MED ORDER — ASPIRIN EC 325 MG PO TBEC: 325.0000 mg | DELAYED_RELEASE_TABLET | Freq: Every day | ORAL | 0 refills | Status: AC

## 2016-03-10 MED ORDER — MELOXICAM 15 MG PO TABS: 15.0000 mg | ORAL_TABLET | Freq: Every day | ORAL | 3 refills | Status: AC

## 2016-03-10 MED ORDER — GABAPENTIN 400 MG PO CAPS: 400.0000 mg | ORAL_CAPSULE | Freq: Three times a day (TID) | ORAL | 3 refills | Status: AC

## 2016-03-10 NOTE — Progress Notes (Signed)
Patient is being discharged home. Changed dressing before discharge. IV removed with cath intact. Scripts and meds last dose given. Follow-up appointment made and discussed. Allowed time for questions.

## 2016-03-10 NOTE — Evaluation (Signed)
Physical Therapy Evaluation Patient Details Name: Hannah Rice MRN: [id number] DOB: [birth year] Today's Date: 03/10/2016   History of Present Illness  Pt is a 46 y.o. female presenting to hospital with R ankle pain and deformity after mechanical fall playing with her dogs in the snow.  Pt s/p 03/09/16 ORIF R ankle fx secondary to trimalleolar fx dislocation.  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  Pt lives with her husband in split level home with stairs to enter.  Currently pt is modified independent with bed mobility and CGA with transfers and short ambulation distances with RW in room.  Pt able to maintain NWB'ing status well during session.  Pt would benefit from skilled PT to address noted impairments and functional limitations.  Recommend pt discharge to home with HHPT and assist for stairs.    Follow Up Recommendations Home health PT (assist for stairs)    Equipment Recommendations  Rolling walker with 5" wheels;3in1 (PT)    Recommendations for Other Services       Precautions / Restrictions Precautions Precautions: Fall Restrictions Weight Bearing Restrictions: Yes RLE Weight Bearing: Non weight bearing      Mobility  Bed Mobility Overal bed mobility: Modified Independent             General bed mobility comments: Supine to sit with HOB elevated without any difficulty.  Transfers Overall transfer level: Needs assistance Equipment used: Rolling walker (2 wheeled) Transfers: Sit to/from [employer] Sit to Stand: Min guard Stand pivot transfers: Min guard (transfer to commode in bathroom)       General transfer comment: initial vc's required for hand and foot placement and technique (including NWB'ing status)  Ambulation/Gait Ambulation/Gait assistance: Min guard Ambulation Distance (Feet):  (15 feet; 25 feet) Assistive device: Rolling walker (2 wheeled)   Gait velocity:  decreased   General Gait Details: NWB'ing R LE; decreased L step length; steady controlled ambulation  Stairs            Wheelchair Mobility    Modified Rankin (Stroke Patients Only)       Balance Overall balance assessment: Needs assistance Sitting-balance support: Bilateral upper extremity supported;Feet unsupported Sitting balance-Leahy Scale: Good     Standing balance support: Single extremity supported (on sink) Standing balance-Leahy Scale: Good Standing balance comment: washing hands at sink after toileting                             Pertinent Vitals/Pain Pain Assessment: 0-10 Pain Score: 2  Pain Location: R ankle Pain Descriptors / Indicators: Sharp;Shooting;Throbbing Pain Intervention(s): Limited activity within patient's tolerance;Monitored during session;Premedicated before session;Repositioned  Vitals (HR and O2 on room air) stable and WFL throughout treatment session.    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  Pt reports 2 falls down stairs in last month.     Hand Dominance  Extremity/Trunk Assessment   Upper Extremity Assessment Upper Extremity Assessment: Overall WFL for tasks assessed    Lower Extremity Assessment Lower Extremity Assessment: RLE deficits/detail;LLE deficits/detail RLE Deficits / Details: hip flexion at least 3+/5; knee flexion/extension at least 3/5; R ankle deferred d/t injury/immobilized LLE Deficits / Details: Strength  and ROM WFL    Cervical / Trunk Assessment Cervical / Trunk Assessment: Normal  Communication   Communication: No difficulties  Cognition Arousal/Alertness: Awake/alert Behavior During Therapy: WFL for tasks assessed/performed Overall Cognitive Status: Within Functional Limits for tasks assessed                      General Comments General comments (skin integrity, edema, etc.): Drainage noted lateral posterior R ankle through dressings (nursing notified)  Pt agreeable to PT session.  Pt educated to have husband bring shoe for L LE to aide in ambulation and stairs.    Exercises  Gait training   Assessment/Plan    PT Assessment Patient needs continued PT services  PT Problem List Decreased strength;Decreased balance;Decreased mobility;Decreased knowledge of use of DME;Decreased knowledge of precautions;Pain          PT Treatment Interventions DME instruction;Gait training;Stair training;Functional mobility training;Therapeutic activities;Therapeutic exercise;Balance training;Patient/family education    PT Goals (Current goals can be found in the Care Plan section)  Acute Rehab PT Goals Patient Stated Goal: to go home PT Goal Formulation: With patient Time For Goal Achievement: 03/24/16 Potential to Achieve Goals: Good    Frequency BID   Barriers to discharge        Co-evaluation               End of Session Equipment Utilized During Treatment: Gait belt Activity Tolerance: Patient tolerated treatment well Patient left: in chair;with call bell/phone within reach;with chair alarm set;with SCD's reapplied (R LE elevated via 2 pillows; L heel elevated via towel roll) Nurse Communication: Mobility status;Precautions;Weight bearing status (Drainage)         Time: [id number] PT Time Calculation (min) (ACUTE ONLY): 36 min   Charges:   PT Evaluation $PT Eval Low Complexity: 1 Procedure PT Treatments $Gait Training: 8-22 mins   PT G CodesHendricks Limes:         Jebidiah Baggerly 03/10/2016, 11:25 AM Hendricks LimesEmily Laderrick Wilk, PT [phone]

## 2016-03-10 NOTE — Care Management Note (Signed)
Case Management Note  Patient Details  Name: Hannah HerrlichMary [name] MRN: [id number] Date of Birth: 05-06-1970  Subjective/Objective:   Spoke with patient. She lives a home with her spouse. 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 Pt recommending home health PT. She will needs a walker. Patient has no agency preference. [facility] to [facility]. [facility]  [phone].  Dr. Hyacinth MeekerMiller states patient does not need crutches he only wants her on a walker. Patient declined [facility].              Action/Plan[facility]   Expected Discharge Date:                  Expected Discharge Plan:  Home w Home Health Services  In-House Referral:     Discharge planning Services  CM Consult  Post Acute Care Choice:  Durable Medical Equipment, Home Health Choice offered to:  Patient  DME Arranged:  Dan HumphreysWalker DME Agency:  [facility]  HH Arranged:  [facility]  Status of Service:  In process, will continue to follow  If discussed at Long Length of Stay Meetings, dates discussed:    Additional Comments:  Marily MemosLisa M Cailean Heacock, RN 03/10/2016, 2:14 PM

## 2016-03-10 NOTE — Discharge Summary (Signed)
Physician Discharge Summary  Patient ID: Hannah HerrlichMary [name] MRN: [id number] DOB/AGE: [birth year] 44 y.o.  Admit date: 03/08/2016 Discharge date: 03/10/2016  Admission Diagnoses:  Displaced trimalleolar fracture dislocation, right ankle  Discharge Diagnoses:  Active Problems:   Closed right ankle fracture, initial encounter   Discharged Condition: good  Hospital Course: The patient was found to have a displaced ankle fracture in emergency room.  This was reduced and splinted in preparation for surgery.  She underwent open reduction internal fixation of the right ankle on Thursday, 03/09/2016.  Excellent fixation was obtained.  She is doing very well today and wishes to go home.  She has been seen by physical therapy and ambulated.  Consults: None  Significant Diagnostic Studies: X-rays of right ankle  Treatments: IV antibiotics and physical therapy  Discharge Exam: Blood pressure 114/75, pulse 87, temperature 98 F (36.7 C), temperature source Oral, resp. rate 18, height 5\' 4"  (1.626 m), weight 62.6 kg (138 lb), last menstrual period 03/01/2016, SpO2 94 %. Doing well.  Mild drainage on dressing.  Neurovascular status good distally.  Ready for discharge.   Patient will return to clinic in 3 days for cast application.  She is to remain nonweightbearing and keep leg elevated at all times.  Disposition: Final discharge disposition not confirmed  Discharge Instructions    Call MD for:  persistant nausea and vomiting    Complete by:  As directed    Call MD for:  redness, tenderness, or signs of infection (pain, swelling, redness, odor or green/yellow discharge around incision site)    Complete by:  As directed    Call MD for:  severe uncontrolled pain    Complete by:  As directed    Call MD for:  temperature >100.4    Complete by:  As directed    Diet - low sodium heart healthy    Complete by:  As directed    Discharge instructions    Complete by:  As directed    Elevate right leg at all  times RTC 3-4 days No weight on right leg   Increase activity slowly    Complete by:  As directed      Allergies as of 03/10/2016      Reactions   Sulfa Antibiotics Hives      Medication List    TAKE these medications   aspirin EC 325 MG tablet Take 1 tablet (325 mg total) by mouth daily.   clonazePAM 0.5 MG tablet Commonly known as:  KLONOPIN Take 0.5 mg by mouth at bedtime as needed for anxiety.   fluticasone 50 MCG/ACT nasal spray Commonly known as:  FLONASE Place 2 sprays into both nostrils daily as needed for allergies or rhinitis.   gabapentin 400 MG capsule Commonly known as:  NEURONTIN Take 1 capsule (400 mg total) by mouth 3 (three) times daily.   HYDROcodone-acetaminophen 7.5-325 MG tablet Commonly known as:  NORCO Take 1 tablet by mouth every 6 (six) hours as needed for moderate pain.   meloxicam 15 MG tablet Commonly known as:  MOBIC Take 1 tablet (15 mg total) by mouth daily.   sertraline 100 MG tablet Commonly known as:  ZOLOFT Take 100 mg by mouth daily.            Durable Medical Equipment        Start     Ordered   03/10/16 1509  For home use only DME Dan HumphreysWalker  [facility]  Once    Question:  Patient needs  a walker to treat with the following condition  Answer:  Closed right ankle fracture, initial encounter   03/10/16 1511   03/09/16 1551  DME Walker rolling  Once    Question:  Patient needs a walker to treat with the following condition  Answer:  Closed right ankle fracture, initial encounter   03/09/16 1550     Follow-up Information    Burtis Imhoff E, MD. Schedule an appointment as soon as possible for a visit in 3 day(s).   Specialty:  Specialist Contact information: [address] [place] [address] [phone]           Signed: Valinda Hoar 03/10/2016, 3:11 PM

## 2016-03-10 NOTE — Progress Notes (Signed)
Subjective: 1 Day Post-Op Procedure(s) (LRB): OPEN REDUCTION INTERNAL FIXATION (ORIF) ANKLE FRACTURE (Right)    Patient reports pain as mild. Did well with PT and is ready to go home.    Objective:   VITALS:   Vitals:   03/10/16 0723 03/10/16 1108  BP: 105/68 114/75  Pulse: 90 87  Resp: 18 18  Temp: 98.2 F (36.8 C) 98 F (36.7 C)    Neurologically intact ABD soft Neurovascular intact Sensation intact distally Intact pulses distally Dorsiflexion/Plantar flexion intact Incision: scant drainage  LABS  Recent Labs  03/08/16 1852  HGB 14.1  HCT 40.9  WBC 16.5*  PLT 243     Recent Labs  03/08/16 1852  NA 142  K 3.6  BUN 6  CREATININE 0.64  GLUCOSE 131*     Recent Labs  03/08/16 1852  INR 1.00     Assessment/Plan: 1 Day Post-Op Procedure(s) (LRB): OPEN REDUCTION INTERNAL FIXATION (ORIF) ANKLE FRACTURE (Right)   Advance diet Up with therapy D/C IV fluids Discharge home with home health

## 2016-03-10 NOTE — Care Management (Signed)
[facility] will "have patient follow up next week."

## 2016-03-10 NOTE — Anesthesia Postprocedure Evaluation (Signed)
Anesthesia Post Note  Patient: Hannah HerrlichMary [name]  Procedure(s) Performed: Procedure(s) (LRB): OPEN REDUCTION INTERNAL FIXATION (ORIF) ANKLE FRACTURE (Right)  Patient location during evaluation: PACU Anesthesia Type: General Level of consciousness: awake and alert Pain management: pain level controlled Vital Signs Assessment: post-procedure vital signs reviewed and stable Respiratory status: spontaneous breathing, nonlabored ventilation, respiratory function stable and patient connected to nasal cannula oxygen Cardiovascular status: blood pressure returned to baseline and stable Postop Assessment: no signs of nausea or vomiting Anesthetic complications: no     Last Vitals:  Vitals:   03/10/16 0205 03/10/16 0449  BP: 126/75 112/68  Pulse: 94 80  Resp: 18   Temp: 36.9 C 36.8 C    Last Pain:  Vitals:   03/10/16 0449  TempSrc: Oral  PainSc:                  Cleda MccreedyJoseph K Deedee Lybarger

## 2016-03-10 NOTE — Progress Notes (Signed)
Physical Therapy Treatment Patient Details Name: Hannah Rice MRN: [id number] DOB: [birth year] Today's Date: 03/10/2016    History of Present Illness Pt is a 46 y.o. female presenting to hospital with R ankle pain and deformity after mechanical fall playing with her dogs in the snow.  Pt s/p 03/09/16 ORIF R ankle fx secondary to trimalleolar fx dislocation.    PT Comments    Pt able to progress to ambulating 120 feet with RW SBA.  Pt ascended/descended stairs (see below for details) with CGA.  Pt progressing well with functional mobility and able to maintain NWB'ing status throughout session's activities.  CM notified of need for axillary crutches (for stairs) as well as RW (for ambulation).  Will continue to progress pt with strengthening, ambulation distance, and increasing independence with functional mobility during pt's hospital stay.   Follow Up Recommendations  Home health PT (assist for stairs)     Equipment Recommendations  Rolling walker with 5" wheels;3in1 (PT);Crutches    Recommendations for Other Services       Precautions / Restrictions Precautions Precautions: Fall Restrictions Weight Bearing Restrictions: Yes RLE Weight Bearing: Non weight bearing    Mobility  Bed Mobility             General bed mobility comments: Deferred d/t pt sitting up in chair beginning of session and requesting to be back in chair end of session.  Transfers Overall transfer level: Modified independent Equipment used: Rolling walker (2 wheeled) Transfers: Sit to/from [employer] Sit to Stand: Modified independent (Device/Increase time) Stand pivot transfers: Modified independent (Device/Increase time)       General transfer comment: appropriate hand placement and technique without cueing  Ambulation/Gait Ambulation/Gait assistance: Supervision Ambulation Distance (Feet):  (120) Assistive device: Rolling walker (2 wheeled)   Gait velocity: decreased    General Gait Details: NWB'ing R LE; decreased L step length; steady controlled ambulation   Stairs Stairs: Yes   Stair Management: One rail Left;Step to pattern;Forwards;With crutches Number of Stairs: 4 General stair comments: ascended/descended 4 stairs with L railing and R axillary crutch CGA; ascended/descended 1 platform step (x3 trials) with RW (backwards ascending; forwards descending); initial demo and vc's required for technique and then pt able to perform on own without further cueing  Wheelchair Mobility    Modified Rankin (Stroke Patients Only)       Balance Overall balance assessment: Needs assistance Sitting-balance support: No upper extremity supported;Feet unsupported Sitting balance-Leahy Scale: Normal     Standing balance support: Bilateral upper extremity supported (on RW) Standing balance-Leahy Scale: Good Standing balance comment: ambulating with RW                    Cognition Arousal/Alertness: Awake/alert Behavior During Therapy: WFL for tasks assessed/performed Overall Cognitive Status: Within Functional Limits for tasks assessed                      Exercises      General Comments General comments (skin integrity, edema, etc.): Drainage noted lateral posterior R ankle through dressings (nursing notified). Pt agreeable to PT session.      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  Pt reports 2 falls down stairs in last month.   PT Goals (current goals can now be found in the care plan section) Acute Rehab PT Goals Patient Stated Goal: to go home PT Goal Formulation: With patient Time For Goal Achievement: 03/24/16 Potential to Achieve Goals: Good Progress towards PT goals: Progressing toward goals    Frequency    BID      PT Plan Current plan remains appropriate    Co-evaluation             End of Session Equipment Utilized During Treatment: Gait belt Activity Tolerance: Patient tolerated treatment well Patient left: in chair;with call bell/phone within reach;with chair alarm set;with SCD's reapplied (L LE elevated on 2 pillows; R heel elevated via towel roll)     Time: 1325-1403 PT Time Calculation (min) (ACUTE ONLY): 38 min  Charges:  $Gait Training: 38-52 mins                    G CodesHendricks Limes:      Cristian Davitt 03/10/2016, 2:19 PM Hendricks LimesEmily Lianni Kanaan, PT [phone]

## 2016-11-15 ENCOUNTER — Other Ambulatory Visit: Payer: Self-pay | Admitting: Obstetrics and Gynecology

## 2016-11-15 DIAGNOSIS — Z1231 Encounter for screening mammogram for malignant neoplasm of breast: Secondary | ICD-10-CM

## 2016-12-26 ENCOUNTER — Ambulatory Visit
Admission: RE | Admit: 2016-12-26 | Discharge: 2016-12-26 | Disposition: A | Discharge status: Home / Self Care | Payer: 59 | Source: Ambulatory Visit | Attending: Obstetrics and Gynecology | Admitting: Obstetrics and Gynecology

## 2016-12-26 DIAGNOSIS — Z1231 Encounter for screening mammogram for malignant neoplasm of breast: Secondary | ICD-10-CM | POA: Diagnosis present

## 2017-01-01 ENCOUNTER — Inpatient Hospital Stay
Admission: RE | Admit: 2017-01-01 | Discharge: 2017-01-01 | Disposition: A | Discharge status: Home / Self Care | Payer: Self-pay | Source: Ambulatory Visit | Attending: *Deleted | Admitting: *Deleted

## 2017-01-01 ENCOUNTER — Other Ambulatory Visit: Payer: Self-pay | Admitting: *Deleted

## 2017-01-01 DIAGNOSIS — Z9289 Personal history of other medical treatment: Secondary | ICD-10-CM

## 2017-06-20 ENCOUNTER — Ambulatory Visit: Payer: 59

## 2017-11-12 ENCOUNTER — Other Ambulatory Visit: Payer: Self-pay | Admitting: Obstetrics and Gynecology

## 2017-11-12 DIAGNOSIS — Z1231 Encounter for screening mammogram for malignant neoplasm of breast: Secondary | ICD-10-CM

## 2017-12-06 ENCOUNTER — Ambulatory Visit
Admission: RE | Admit: 2017-12-06 | Discharge: 2017-12-06 | Disposition: A | Discharge status: Home / Self Care | Payer: Managed Care, Other (non HMO) | Source: Ambulatory Visit | Attending: Obstetrics and Gynecology | Admitting: Obstetrics and Gynecology

## 2017-12-06 DIAGNOSIS — Z1231 Encounter for screening mammogram for malignant neoplasm of breast: Secondary | ICD-10-CM | POA: Insufficient documentation

## 2020-02-01 IMAGING — MG MM DIGITAL SCREENING BILAT W/ TOMO W/ CAD
8 series · 8 of 24 positions shown · non-contrast
Comparison: Previous exam(s).

CLINICAL DATA: Screening.

EXAM:
DIGITAL SCREENING BILATERAL MAMMOGRAM WITH TOMO AND CAD

[L MLO synth-2D]
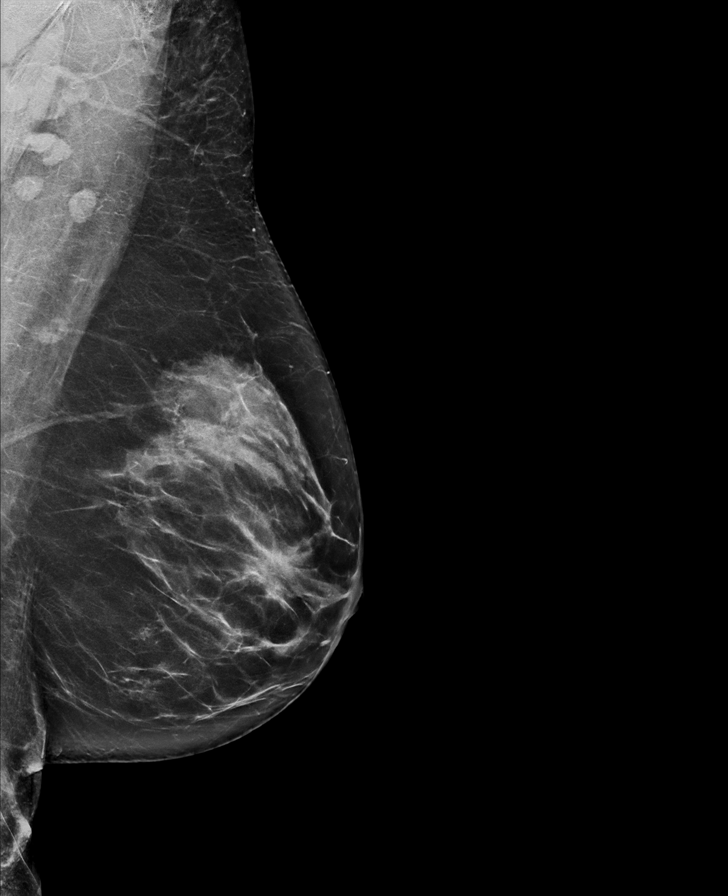

[R CC synth-2D]
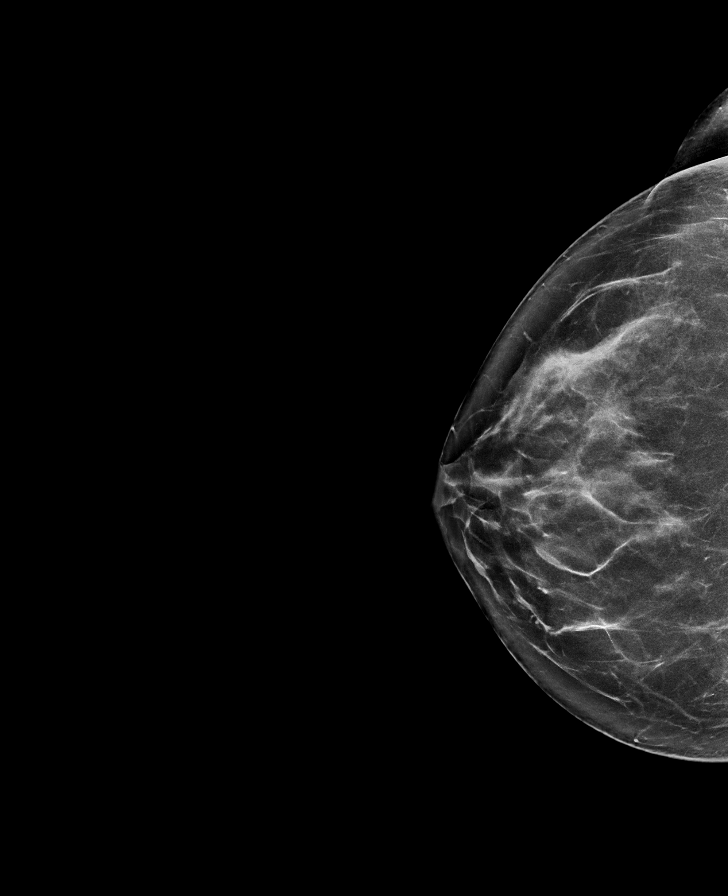

[L CC synth-2D]
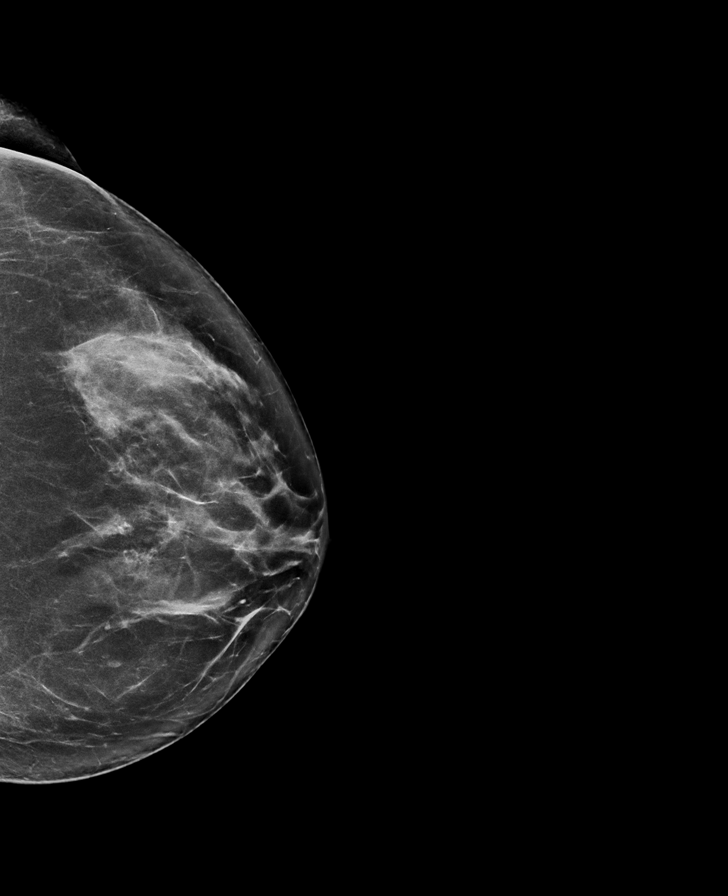

[R MLO synth-2D]
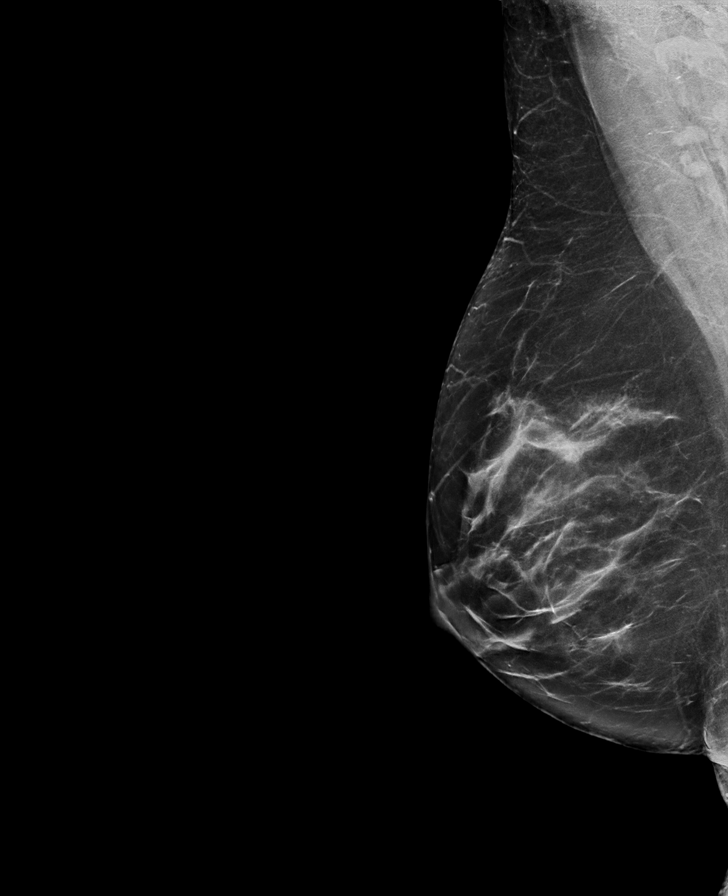

[L MLO tomo · tomo slice 39/77.0]
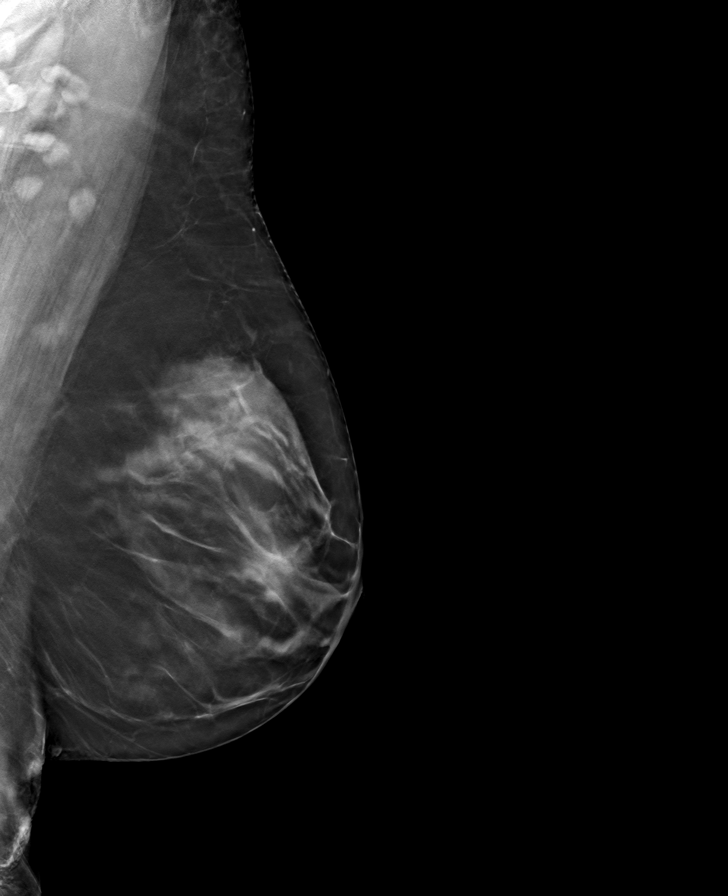

[R CC tomo · tomo slice 37/73.0]
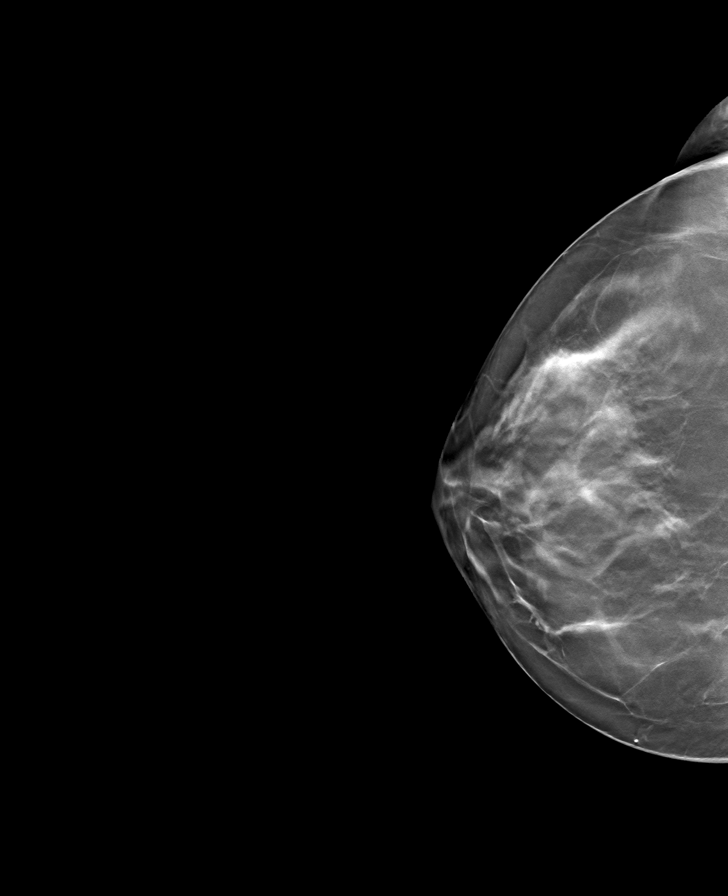

[R MLO tomo · tomo slice 41/80.0]
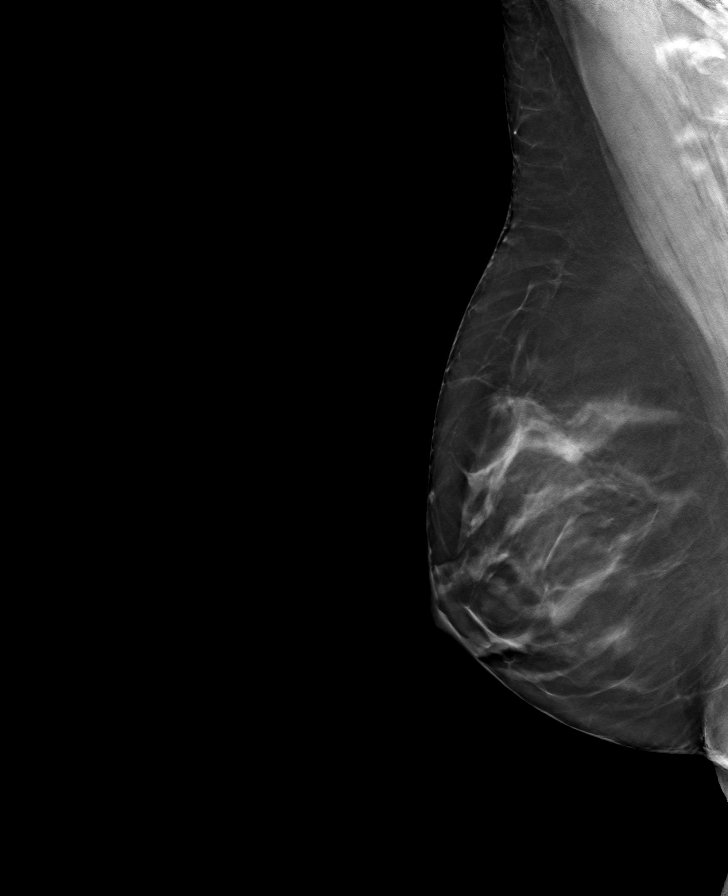

[L CC tomo · tomo slice 37/72.0]
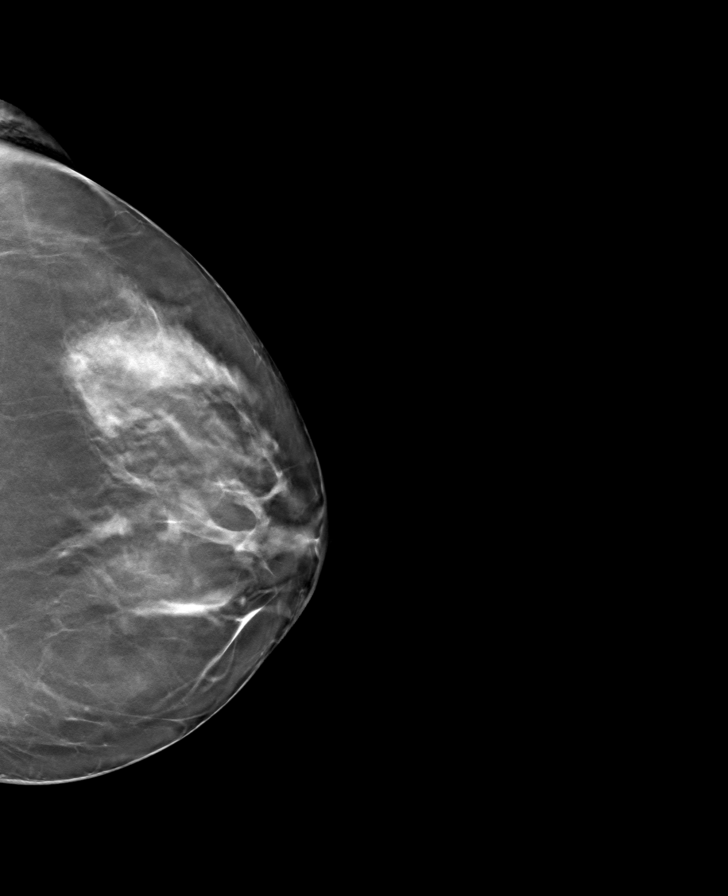

[8 of 24 positions shown; findings below may reference images not displayed]

ACR Breast Density Category c: The breast tissue is heterogeneously
dense, which may obscure small masses.
FINDINGS: There are no findings suspicious for malignancy. Images were
processed with CAD.
IMPRESSION: No mammographic evidence of malignancy. A result letter of this
screening mammogram will be mailed directly to the patient.

RECOMMENDATION:
Screening mammogram in one year. (Code:FT-U-LHB)

BI-RADS CATEGORY  1: Negative.
